# Patient Record
Sex: Male | Born: 1990 | Race: Black or African American | Hispanic: No | Smoking: Never smoker
Health system: Southern US, Community
[De-identification: ages and names within clinical notes are randomized; demographics above are authoritative.]

## PROBLEM LIST (undated history)

## (undated) HISTORY — PX: WISDOM TOOTH EXTRACTION: SHX21

---

## 2017-04-17 ENCOUNTER — Emergency Department (HOSPITAL_COMMUNITY): Payer: 59

## 2017-04-17 ENCOUNTER — Inpatient Hospital Stay (HOSPITAL_COMMUNITY): Payer: 59

## 2017-04-17 ENCOUNTER — Encounter (HOSPITAL_COMMUNITY): Payer: Self-pay | Admitting: Radiology

## 2017-04-17 ENCOUNTER — Inpatient Hospital Stay (HOSPITAL_COMMUNITY)
Admission: EM | Admit: 2017-04-17 | Discharge: 2017-04-21 | DRG: 963 | Disposition: A | Discharge status: Home / Self Care | Payer: 59 | Attending: General Surgery | Admitting: General Surgery

## 2017-04-17 DIAGNOSIS — W231XXA Caught, crushed, jammed, or pinched between stationary objects, initial encounter: Secondary | ICD-10-CM | POA: Diagnosis present

## 2017-04-17 DIAGNOSIS — G931 Anoxic brain damage, not elsewhere classified: Secondary | ICD-10-CM | POA: Diagnosis present

## 2017-04-17 DIAGNOSIS — J690 Pneumonitis due to inhalation of food and vomit: Secondary | ICD-10-CM | POA: Diagnosis present

## 2017-04-17 DIAGNOSIS — S270XXA Traumatic pneumothorax, initial encounter: Secondary | ICD-10-CM | POA: Diagnosis present

## 2017-04-17 DIAGNOSIS — N179 Acute kidney failure, unspecified: Secondary | ICD-10-CM | POA: Diagnosis present

## 2017-04-17 DIAGNOSIS — Y92810 Car as the place of occurrence of the external cause: Secondary | ICD-10-CM | POA: Diagnosis not present

## 2017-04-17 DIAGNOSIS — R402342 Coma scale, best motor response, flexion withdrawal, at arrival to emergency department: Secondary | ICD-10-CM | POA: Diagnosis present

## 2017-04-17 DIAGNOSIS — H1133 Conjunctival hemorrhage, bilateral: Secondary | ICD-10-CM | POA: Diagnosis present

## 2017-04-17 DIAGNOSIS — R0901 Asphyxia: Secondary | ICD-10-CM | POA: Diagnosis present

## 2017-04-17 DIAGNOSIS — R258 Other abnormal involuntary movements: Secondary | ICD-10-CM | POA: Diagnosis present

## 2017-04-17 DIAGNOSIS — R402212 Coma scale, best verbal response, none, at arrival to emergency department: Secondary | ICD-10-CM | POA: Diagnosis present

## 2017-04-17 DIAGNOSIS — S280XXA Crushed chest, initial encounter: Principal | ICD-10-CM | POA: Diagnosis present

## 2017-04-17 DIAGNOSIS — E876 Hypokalemia: Secondary | ICD-10-CM | POA: Diagnosis present

## 2017-04-17 DIAGNOSIS — S299XXA Unspecified injury of thorax, initial encounter: Secondary | ICD-10-CM | POA: Diagnosis present

## 2017-04-17 DIAGNOSIS — J9601 Acute respiratory failure with hypoxia: Secondary | ICD-10-CM | POA: Diagnosis present

## 2017-04-17 DIAGNOSIS — S27321A Contusion of lung, unilateral, initial encounter: Secondary | ICD-10-CM | POA: Diagnosis not present

## 2017-04-17 DIAGNOSIS — Z9289 Personal history of other medical treatment: Secondary | ICD-10-CM

## 2017-04-17 DIAGNOSIS — D72829 Elevated white blood cell count, unspecified: Secondary | ICD-10-CM | POA: Diagnosis present

## 2017-04-17 DIAGNOSIS — S27322A Contusion of lung, bilateral, initial encounter: Secondary | ICD-10-CM | POA: Diagnosis not present

## 2017-04-17 DIAGNOSIS — S27329A Contusion of lung, unspecified, initial encounter: Secondary | ICD-10-CM | POA: Insufficient documentation

## 2017-04-17 DIAGNOSIS — R402112 Coma scale, eyes open, never, at arrival to emergency department: Secondary | ICD-10-CM | POA: Diagnosis present

## 2017-04-17 DIAGNOSIS — T17908A Unspecified foreign body in respiratory tract, part unspecified causing other injury, initial encounter: Secondary | ICD-10-CM

## 2017-04-17 DIAGNOSIS — R042 Hemoptysis: Secondary | ICD-10-CM | POA: Insufficient documentation

## 2017-04-17 LAB — PREPARE FRESH FROZEN PLASMA
UNIT DIVISION: 0
Unit division: 0

## 2017-04-17 LAB — APTT: aPTT: 23 seconds — ABNORMAL LOW (ref 24–36)

## 2017-04-17 LAB — BPAM RBC
Blood Product Expiration Date: 201807122359
Blood Product Expiration Date: 201807132359
ISSUE DATE / TIME: 201807081527
ISSUE DATE / TIME: 201807081527
UNIT TYPE AND RH: 9500
Unit Type and Rh: 9500

## 2017-04-17 LAB — COMPREHENSIVE METABOLIC PANEL
ALK PHOS: 50 U/L (ref 38–126)
ALT: 64 U/L — AB (ref 17–63)
ANION GAP: 11 (ref 5–15)
AST: 91 U/L — ABNORMAL HIGH (ref 15–41)
Albumin: 4.2 g/dL (ref 3.5–5.0)
BILIRUBIN TOTAL: 0.6 mg/dL (ref 0.3–1.2)
BUN: 11 mg/dL (ref 6–20)
CALCIUM: 8.7 mg/dL — AB (ref 8.9–10.3)
CO2: 23 mmol/L (ref 22–32)
CREATININE: 1.51 mg/dL — AB (ref 0.61–1.24)
Chloride: 105 mmol/L (ref 101–111)
Glucose, Bld: 152 mg/dL — ABNORMAL HIGH (ref 65–99)
Potassium: 4.1 mmol/L (ref 3.5–5.1)
Sodium: 139 mmol/L (ref 135–145)
TOTAL PROTEIN: 6.7 g/dL (ref 6.5–8.1)

## 2017-04-17 LAB — TYPE AND SCREEN
ABO/RH(D): O POS
ANTIBODY SCREEN: NEGATIVE
UNIT DIVISION: 0
Unit division: 0

## 2017-04-17 LAB — BPAM FFP
BLOOD PRODUCT EXPIRATION DATE: 201807122359
Blood Product Expiration Date: 201807122359
ISSUE DATE / TIME: 201807081528
ISSUE DATE / TIME: 201807081528
UNIT TYPE AND RH: 600
Unit Type and Rh: 6200

## 2017-04-17 LAB — BASIC METABOLIC PANEL
ANION GAP: 9 (ref 5–15)
BUN: 11 mg/dL (ref 6–20)
CALCIUM: 8.5 mg/dL — AB (ref 8.9–10.3)
CO2: 23 mmol/L (ref 22–32)
CREATININE: 1.3 mg/dL — AB (ref 0.61–1.24)
Chloride: 105 mmol/L (ref 101–111)
GFR calc Af Amer: >60 mL/min (ref >60)
GLUCOSE: 111 mg/dL — AB (ref 65–99)
Potassium: 3.9 mmol/L (ref 3.5–5.1)
Sodium: 137 mmol/L (ref 135–145)

## 2017-04-17 LAB — I-STAT ARTERIAL BLOOD GAS, ED
BICARBONATE: 27.3 mmol/L (ref 20.0–28.0)
O2 SAT: 90 %
PCO2 ART: 53.1 mmHg — AB (ref 32.0–48.0)
Patient temperature: 98.6
TCO2: 29 mmol/L (ref 0–100)
pH, Arterial: 7.319 — ABNORMAL LOW (ref 7.350–7.450)
pO2, Arterial: 64 mmHg — ABNORMAL LOW (ref 83.0–108.0)

## 2017-04-17 LAB — CBC WITH DIFFERENTIAL/PLATELET
Basophils Absolute: 0 10*3/uL (ref 0.0–0.1)
Basophils Relative: 0 %
EOS PCT: 2 %
Eosinophils Absolute: 0.2 10*3/uL (ref 0.0–0.7)
HEMATOCRIT: 40.2 % (ref 39.0–52.0)
Hemoglobin: 13.5 g/dL (ref 13.0–17.0)
LYMPHS PCT: 33 %
Lymphs Abs: 3.6 10*3/uL (ref 0.7–4.0)
MCH: 28.2 pg (ref 26.0–34.0)
MCHC: 33.6 g/dL (ref 30.0–36.0)
MCV: 84.1 fL (ref 78.0–100.0)
MONO ABS: 0.6 10*3/uL (ref 0.1–1.0)
MONOS PCT: 5 %
NEUTROS ABS: 6.3 10*3/uL (ref 1.7–7.7)
Neutrophils Relative %: 60 %
PLATELETS: 317 10*3/uL (ref 150–400)
RBC: 4.78 MIL/uL (ref 4.22–5.81)
RDW: 12.5 % (ref 11.5–15.5)
WBC: 10.7 10*3/uL — ABNORMAL HIGH (ref 4.0–10.5)

## 2017-04-17 LAB — PROTIME-INR
INR: 1.09
INR: 1.1
Prothrombin Time: 14.2 seconds (ref 11.4–15.2)
Prothrombin Time: 14.2 seconds (ref 11.4–15.2)

## 2017-04-17 LAB — ABO/RH: ABO/RH(D): O POS

## 2017-04-17 LAB — TROPONIN I: TROPONIN I: 18.86 ng/mL — AB (ref <0.03)

## 2017-04-17 LAB — GLUCOSE, CAPILLARY
GLUCOSE-CAPILLARY: 84 mg/dL (ref 65–99)
GLUCOSE-CAPILLARY: 75 mg/dL (ref 65–99)
Glucose-Capillary: 78 mg/dL (ref 65–99)

## 2017-04-17 LAB — TRIGLYCERIDES
Triglycerides: 73 mg/dL (ref <150)
Triglycerides: 58 mg/dL (ref <150)

## 2017-04-17 LAB — CK: CK TOTAL: 535 U/L — AB (ref 49–397)

## 2017-04-17 LAB — MRSA PCR SCREENING: MRSA BY PCR: NEGATIVE

## 2017-04-17 LAB — ETHANOL: Alcohol, Ethyl (B): <5 mg/dL (ref <5)

## 2017-04-17 MED ORDER — CISATRACURIUM BOLUS VIA INFUSION
0.1000 mg/kg | Freq: Once | INTRAVENOUS | Status: DC
  Filled 2017-04-17: qty 9

## 2017-04-17 MED ORDER — FENTANYL CITRATE (PF) 100 MCG/2ML IJ SOLN: 50.0000 ug | Freq: Once | INTRAMUSCULAR | Status: DC

## 2017-04-17 MED ORDER — FENTANYL BOLUS VIA INFUSION
50.0000 ug | INTRAVENOUS | Status: DC | PRN
  Filled 2017-04-17: qty 50

## 2017-04-17 MED ORDER — FENTANYL CITRATE (PF) 100 MCG/2ML IJ SOLN: 100.0000 ug | Freq: Once | INTRAMUSCULAR | Status: DC

## 2017-04-17 MED ORDER — SUCCINYLCHOLINE CHLORIDE 20 MG/ML IJ SOLN
INTRAMUSCULAR | Status: AC | PRN
  Administered 2017-04-17: 150 mg via INTRAVENOUS

## 2017-04-17 MED ORDER — FENTANYL CITRATE (PF) 100 MCG/2ML IJ SOLN: 100.0000 ug | INTRAMUSCULAR | Status: DC | PRN

## 2017-04-17 MED ORDER — PANTOPRAZOLE SODIUM 40 MG PO TBEC: 40.0000 mg | DELAYED_RELEASE_TABLET | Freq: Every day | ORAL | Status: DC

## 2017-04-17 MED ORDER — PROPOFOL 1000 MG/100ML IV EMUL
INTRAVENOUS | Status: AC
  Filled 2017-04-17: qty 100

## 2017-04-17 MED ORDER — SODIUM CHLORIDE 0.9 % IV SOLN: 250.0000 mL | INTRAVENOUS | Status: DC | PRN

## 2017-04-17 MED ORDER — SODIUM CHLORIDE 0.9 % IV SOLN
2.0000 mg/h | INTRAVENOUS | Status: DC
  Administered 2017-04-17: 2 mg/h via INTRAVENOUS
  Administered 2017-04-18 (×3): 8 mg/h via INTRAVENOUS
  Administered 2017-04-18: 2 mg/h via INTRAVENOUS
  Administered 2017-04-19: 8 mg/h via INTRAVENOUS
  Filled 2017-04-17 (×6): qty 10

## 2017-04-17 MED ORDER — DEXTROSE 5 % IV SOLN
1.0000 g | INTRAVENOUS | Status: DC
  Administered 2017-04-17: 1 g via INTRAVENOUS
  Filled 2017-04-17: qty 10

## 2017-04-17 MED ORDER — NOREPINEPHRINE BITARTRATE 1 MG/ML IV SOLN
0.0000 ug/min | INTRAVENOUS | Status: DC
  Administered 2017-04-18: 2 ug/min via INTRAVENOUS
  Filled 2017-04-17 (×2): qty 4

## 2017-04-17 MED ORDER — PANTOPRAZOLE SODIUM 40 MG IV SOLR
40.0000 mg | Freq: Every day | INTRAVENOUS | Status: DC
  Administered 2017-04-17: 40 mg via INTRAVENOUS
  Filled 2017-04-17: qty 40

## 2017-04-17 MED ORDER — ASPIRIN 300 MG RE SUPP
300.0000 mg | RECTAL | Status: AC
  Administered 2017-04-18: 300 mg via RECTAL
  Filled 2017-04-17: qty 1

## 2017-04-17 MED ORDER — FENTANYL 2500MCG IN NS 250ML (10MCG/ML) PREMIX INFUSION
25.0000 ug/h | INTRAVENOUS | Status: DC
  Filled 2017-04-17: qty 250

## 2017-04-17 MED ORDER — PROPOFOL 1000 MG/100ML IV EMUL
0.0000 ug/kg/min | INTRAVENOUS | Status: DC
  Administered 2017-04-17: 40 ug/kg/min via INTRAVENOUS

## 2017-04-17 MED ORDER — ACETAMINOPHEN 325 MG PO TABS
650.0000 mg | ORAL_TABLET | ORAL | Status: DC | PRN
  Administered 2017-04-19 – 2017-04-20 (×5): 650 mg via ORAL
  Filled 2017-04-17 (×5): qty 2

## 2017-04-17 MED ORDER — PROPOFOL 1000 MG/100ML IV EMUL: 25.0000 ug/kg/min | INTRAVENOUS | Status: DC

## 2017-04-17 MED ORDER — SODIUM CHLORIDE 0.9 % IV SOLN: INTRAVENOUS | Status: DC

## 2017-04-17 MED ORDER — SODIUM CHLORIDE 0.9 % IV SOLN
1.0000 ug/kg/min | INTRAVENOUS | Status: DC
  Filled 2017-04-17: qty 20

## 2017-04-17 MED ORDER — FENTANYL 2500MCG IN NS 250ML (10MCG/ML) PREMIX INFUSION
100.0000 ug/h | INTRAVENOUS | Status: DC
  Administered 2017-04-17: 300 ug/h via INTRAVENOUS
  Administered 2017-04-17: 100 ug/h via INTRAVENOUS
  Administered 2017-04-18 – 2017-04-19 (×5): 300 ug/h via INTRAVENOUS
  Filled 2017-04-17 (×5): qty 250

## 2017-04-17 MED ORDER — PROPOFOL 10 MG/ML IV BOLUS
INTRAVENOUS | Status: AC | PRN
  Administered 2017-04-17: 860 ug via INTRAVENOUS

## 2017-04-17 MED ORDER — MIDAZOLAM HCL 2 MG/2ML IJ SOLN: 2.0000 mg | Freq: Once | INTRAMUSCULAR | Status: DC

## 2017-04-17 MED ORDER — CISATRACURIUM BOLUS VIA INFUSION
0.1000 mg/kg | Freq: Once | INTRAVENOUS | Status: AC
  Administered 2017-04-17: 8.6 mg via INTRAVENOUS

## 2017-04-17 MED ORDER — MIDAZOLAM BOLUS VIA INFUSION
2.0000 mg | INTRAVENOUS | Status: DC | PRN
  Administered 2017-04-18: 2 mg via INTRAVENOUS
  Filled 2017-04-17: qty 2

## 2017-04-17 MED ORDER — CISATRACURIUM BOLUS VIA INFUSION
0.0500 mg/kg | INTRAVENOUS | Status: AC | PRN
  Administered 2017-04-18: 4.3 mg via INTRAVENOUS
  Filled 2017-04-17: qty 5

## 2017-04-17 MED ORDER — SODIUM CHLORIDE 0.9 % IV SOLN
3.0000 g | Freq: Four times a day (QID) | INTRAVENOUS | Status: DC
  Administered 2017-04-17 – 2017-04-20 (×11): 3 g via INTRAVENOUS
  Filled 2017-04-17 (×14): qty 3

## 2017-04-17 MED ORDER — INSULIN ASPART 100 UNIT/ML ~~LOC~~ SOLN: 1.0000 [IU] | SUBCUTANEOUS | Status: DC

## 2017-04-17 MED ORDER — SODIUM CHLORIDE 0.9 % IV SOLN
1.0000 ug/kg/min | INTRAVENOUS | Status: DC
  Administered 2017-04-17 (×2): 1.5 ug/kg/min via INTRAVENOUS
  Administered 2017-04-17: 1 ug/kg/min via INTRAVENOUS
  Administered 2017-04-18 – 2017-04-19 (×2): 1.5 ug/kg/min via INTRAVENOUS
  Filled 2017-04-17 (×2): qty 20

## 2017-04-17 MED ORDER — ETOMIDATE 2 MG/ML IV SOLN
INTRAVENOUS | Status: AC | PRN
  Administered 2017-04-17: 20 mg via INTRAVENOUS

## 2017-04-17 MED ORDER — PROPOFOL 1000 MG/100ML IV EMUL
0.0000 ug/kg/min | INTRAVENOUS | Status: DC
  Administered 2017-04-17: 30 ug/kg/min via INTRAVENOUS
  Administered 2017-04-17: 20 ug/kg/min via INTRAVENOUS
  Filled 2017-04-17: qty 100

## 2017-04-17 MED ORDER — ARTIFICIAL TEARS OPHTHALMIC OINT
1.0000 "application " | TOPICAL_OINTMENT | Freq: Three times a day (TID) | OPHTHALMIC | Status: DC
  Filled 2017-04-17: qty 3.5

## 2017-04-17 MED ORDER — CISATRACURIUM BOLUS VIA INFUSION
0.0500 mg/kg | INTRAVENOUS | Status: DC | PRN
  Filled 2017-04-17: qty 5

## 2017-04-17 MED ORDER — SODIUM CHLORIDE 0.9 % IV SOLN
INTRAVENOUS | Status: DC
  Administered 2017-04-20: 01:00:00 via INTRAVENOUS

## 2017-04-17 MED ORDER — FENTANYL 2500MCG IN NS 250ML (10MCG/ML) PREMIX INFUSION: 100.0000 ug/h | INTRAVENOUS | Status: DC

## 2017-04-17 MED ORDER — FAMOTIDINE IN NACL 20-0.9 MG/50ML-% IV SOLN
20.0000 mg | Freq: Two times a day (BID) | INTRAVENOUS | Status: DC
  Administered 2017-04-17 – 2017-04-19 (×5): 20 mg via INTRAVENOUS
  Filled 2017-04-17 (×5): qty 50

## 2017-04-17 MED ORDER — DOCUSATE SODIUM 50 MG/5ML PO LIQD
100.0000 mg | Freq: Two times a day (BID) | ORAL | Status: DC | PRN
  Filled 2017-04-17: qty 10

## 2017-04-17 MED ORDER — SODIUM CHLORIDE 0.9 % IV SOLN
1000.0000 mg | Freq: Once | INTRAVENOUS | Status: AC
  Administered 2017-04-17: 1000 mg via INTRAVENOUS
  Filled 2017-04-17: qty 10

## 2017-04-17 MED ORDER — SODIUM CHLORIDE 0.9 % IV SOLN
0.5000 mg/h | INTRAVENOUS | Status: DC
  Filled 2017-04-17: qty 10

## 2017-04-17 MED ORDER — ARTIFICIAL TEARS OPHTHALMIC OINT
1.0000 "application " | TOPICAL_OINTMENT | Freq: Three times a day (TID) | OPHTHALMIC | Status: DC
  Administered 2017-04-17 – 2017-04-19 (×5): 1 via OPHTHALMIC

## 2017-04-17 MED ORDER — ONDANSETRON HCL 4 MG/2ML IJ SOLN
4.0000 mg | Freq: Four times a day (QID) | INTRAMUSCULAR | Status: DC | PRN
  Administered 2017-04-19 – 2017-04-20 (×2): 4 mg via INTRAVENOUS
  Filled 2017-04-17 (×2): qty 2

## 2017-04-17 MED ORDER — IOPAMIDOL (ISOVUE-300) INJECTION 61%
INTRAVENOUS | Status: AC
  Administered 2017-04-17: 75 mL
  Filled 2017-04-17: qty 75

## 2017-04-17 NOTE — ED Notes (Signed)
Pt intubated with 7.5 ET  secured 21 at the teeth , good color change , chest x ray ordered

## 2017-04-17 NOTE — ED Provider Notes (Signed)
INTUBATION Performed by: Orson SlickAndrew Soua Caltagirone  Required items: required blood products, implants, devices, and special equipment available Patient identity confirmed: provided demographic data and hospital-assigned identification number Time out: Immediately prior to procedure a "time out" was called to verify the correct patient, procedure, equipment, support staff and site/side marked as required. Procedure performed in emergent care.  Indications: Airway protection, oxygen desaturation.   Intubation method: Glidescope Laryngoscopy   Preoxygenation: BVM  Sedatives: Etomidate Paralytic: Succinylcholine  Tube Size: 7.5 cuffed, 21 cm at the lip   Post-procedure assessment: chest rise and ETCO2 monitor Breath sounds: equal and absent over the epigastrium Tube secured with: ETT holder Chest x-ray interpreted by radiologist and me.  Chest x-ray findings: endotracheal tube in appropriate position  Patient tolerated the procedure well with no immediate complications.      Orson Slickolson, Brittan Mapel, MD 04/17/17 Dorothy [id number]    Rolland PorterJames, Mark, MD 04/27/17 [phone]

## 2017-04-17 NOTE — Procedures (Signed)
Central Venous Catheter Insertion Procedure Note Mitchell BeetsDuane Snyder [id number] 1991/04/22  Procedure: Insertion of Central Venous Catheter Indications: Assessment of intravascular volume, Drug and/or fluid administration and Frequent blood sampling  Procedure Details Consent: Unable to obtain consent because of emergent medical necessity. Time Out: Verified patient identification, verified procedure, site/side was marked, verified correct patient position, special equipment/implants available, medications/allergies/relevent history reviewed, required imaging and test results available.  Performed  Maximum sterile technique was used including antiseptics, cap, gloves, gown, hand hygiene, mask and sheet. Skin prep: Chlorhexidine; local anesthetic administered A antimicrobial bonded/coated triple lumen catheter was placed in the left femoral vein due to traumatic injury/in c-collar using the Seldinger technique.  Evaluation Blood flow good Complications: No apparent complications Patient did tolerate procedure well. Chest X-ray ordered to verify placement.  Mitchell KussmaulKatalina Kristyna Snyder, AGACNP-BC [facility]  Pgr: [phone]  PCCM Pgr: [phone]

## 2017-04-17 NOTE — ED Notes (Signed)
Hypothermia pads applied to pt; afterwards pt was agitated and moving all extremities; reached up with right arm into air; not following commands; bolus of sedation given
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
## 2017-04-17 NOTE — Progress Notes (Signed)
   Met w/ family at ED Consult B.  Escorted 2 family members to bedside ([facility]).  Family remain at bedside.  Will follow, as needed.

## 2017-04-17 NOTE — H&P (Addendum)
History   Mitchell Snyder is an 26 y.o. male.   Chief Complaint:  Chief Complaint  Patient presents with  . Trauma    Trauma Mechanism of injury: crush Injury Injury location: torso, face and head/neck Injury location detail: head, face and R chest and L chest Incident location: home Time since incident: 30 minutes Arrived directly from scene: yes   Protective equipment:       None      Suspicion of alcohol use: no      Suspicion of drug use: no  EMS/PTA data:      Bystander interventions: none      Ambulatory at scene: no      Blood loss: none      Responsiveness: unresponsive      Loss of consciousness: yes      Loss of consciousness duration: 30 minutes      Airway interventions: none      Breathing interventions: oxygen      IV access: established      IO access: none      Fluids administered: normal saline      Cardiac interventions: none      Medications administered: Versed.      Immobilization: long board and C-collar      Airway condition since incident: worsening      Breathing condition since incident: worsening      Circulation condition since incident: stable      Mental status condition since incident: stable      Disability condition since incident: worsening  Current symptoms:      Associated symptoms:            Reports loss of consciousness.   Relevant PMH:      Tetanus status: unknown      The patient has not been admitted to the hospital due to injury in the past year, and has not been treated and released from the ED due to injury in the past year.   No past medical history on file.  No past surgical history on file.  No family history on file. Social History:  has no tobacco, alcohol, and drug history on file.  Allergies  Not on File  Home Medications   (Not in a hospital admission)  Trauma Course   Results for orders placed or performed during the hospital encounter of 04/17/17 (from the past 48 hour(s))  Type and screen      Status: None (Preliminary result)   Collection Time: 04/17/17  3:24 PM  Result Value Ref Range   ABO/RH(D) PENDING    Antibody Screen PENDING    Sample Expiration 04/20/2017    Unit Number [id number]    Blood Component Type RBC, LR IRR    Unit division 00    Status of Unit ISSUED    Unit tag comment VERBAL ORDERS PER DR Ingeborg Fite    Transfusion Status OK TO TRANSFUSE    Crossmatch Result PENDING    Unit Number [id number]    Blood Component Type RED CELLS,LR    Unit division 00    Status of Unit ISSUED    Unit tag comment VERBAL ORDERS PER DR Markelle Asaro    Transfusion Status OK TO TRANSFUSE    Crossmatch Result PENDING   Prepare fresh frozen plasma     Status: None (Preliminary result)   Collection Time: 04/17/17  3:24 PM  Result Value Ref Range   Unit Number [id number]    Blood Component Type THAWED PLASMA  Unit division 00    Status of Unit ISSUED    Unit tag comment VERBAL ORDERS PER DR Tylon Kemmerling    Transfusion Status OK TO TRANSFUSE    Unit Number [id number]    Blood Component Type THAWED PLASMA    Unit division 00    Status of Unit ISSUED    Unit tag comment VERBAL ORDERS PER DR Fayrene FearingJAMES    Transfusion Status OK TO TRANSFUSE   I-Stat arterial blood gas, ED     Status: Abnormal   Collection Time: 04/17/17  3:54 PM  Result Value Ref Range   pH, Arterial 7.319 (L) 7.350 - 7.450   pCO2 arterial 53.1 (H) 32.0 - 48.0 mmHg   pO2, Arterial 64.0 (L) 83.0 - 108.0 mmHg   Bicarbonate 27.3 20.0 - 28.0 mmol/L   TCO2 29 0 - 100 mmol/L   O2 Saturation 90.0 %   Patient temperature 98.6 F    Collection site RADIAL, ALLEN'S TEST ACCEPTABLE    Drawn by RT    Sample type ARTERIAL    No results found.  Review of Systems  Neurological: Positive for loss of consciousness.    There were no vitals taken for this visit. Physical Exam  HENT:  Head: Normocephalic.    Nose: No nose lacerations or nasal deformity. Epistaxis is observed.  Mouth/Throat: Oropharynx is clear and moist.    Ear canals full of wax  Eyes: Conjunctivae and EOM are normal. Pupils are equal, round, and reactive to light. Right pupil is reactive. Left pupil is reactive.  Dysconjugate gaze  Neck: Normal range of motion. Neck supple.  Cardiovascular: Normal rate and normal heart sounds.   Respiratory: He has no wheezes. He has no rales.    GI: Soft. Bowel sounds are normal.  FAST negative  Genitourinary: Rectum normal, prostate normal and penis normal.  Genitourinary Comments: Normal rectal tone, no gross blood  Musculoskeletal:  Some posturing  Neurological: He is unresponsive. He displays abnormal reflex. GCS eye subscore is 1. GCS verbal subscore is 2. GCS motor subscore is 3. He displays Babinski's sign on the right side. He displays Babinski's sign on the left side.  Reflex Scores:      Bicep reflexes are 3+ on the right side and 3+ on the left side.      Patellar reflexes are 3+ on the right side and 3+ on the left side.      Achilles reflexes are 3+ on the right side and 3+ on the left side. Skin: Skin is warm and dry.     Assessment/Plan Crush compression injury to chest with likely hypoxic brain injury  I have reviewed the scans with the radiologist:  1.  No intracranial injury; 2.  No apparent C-spine injury; 3.  Either right upper pulmonary contusion versus aspiration; 4.  No evidence of increase intracranial pressure; 5.  FAST examination not indicative on intra-abdominal bleeding or fluid; 6.  No evidence of maxillo-facial injury 7.  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 Amada JupiterKirkpatrick) to see the patient and he is considering hypothermia for potential anoxic insult.  Otherwise I will admit the patien to the Trauma ICU with sedation. Family not present.  Calahan Pak 04/17/2017, 4:05 PM   Procedures   FAST examination without evidence of acute hemorrhage or intraperitoneal  blood:      EPIG   RUQ   LUQ   PELVIC  FAST negative

## 2017-04-17 NOTE — Consult Note (Addendum)
Neurology Consultation Reason for Consult: Anoxic brain injury Referring Physician: Jimmye NormanWyatt, James  CC: Anoxic brain injury  History is obtained from: Medical record  HPI: Mitchell Snyder is a 26 y.o. male who was working on his car when the [place] stand gave way and the brake drum fell on his chest causing suffocation. He was brought in as a level one trauma, but his only clear  source of injury is hypoxia at this time. He is posturing to any type of stimulus, but no clear seizure activity other than this.  He was given a dose of Keppra per trauma for the extensor posturing.  ROS: Unable to obtain due to altered mental status.   Past medical history: Unable to obtain due to altered mental status  Family history: Unable to obtain due to altered mental status  Social History: Unable to obtain due to altered mental status  Exam: Current vital signs: BP 105/68   Pulse 86   Temp (!) 97.2 F (36.2 C)   Resp (!) 23   Ht 6' (1.829 m)   Wt 86.2 kg (190 lb)   SpO2 100%   BMI 25.77 kg/m  Vital signs in last 24 hours: Temp:  [97.2 F (36.2 C)] 97.2 F (36.2 C) (07/08 1700) Pulse Rate:  [74-106] 86 (07/08 1700) Resp:  [20-30] 23 (07/08 1700) BP: (95-123)/(46-88) 105/68 (07/08 1700) SpO2:  [87 %-100 %] 100 % (07/08 1700) FiO2 (%):  [100 %] 100 % (07/08 1600) Weight:  [86.2 kg (190 lb)] 86.2 kg (190 lb) (07/08 1656)   Physical Exam  Constitutional: Appears well-developed and well-nourished.  Psych: Affect appropriate to situation Eyes: No scleral injection HENT: Intubated Head: Normocephalic.  Cardiovascular: Normal rate and regular rhythm.  Respiratory: Ventilated GI: Soft.  No distension. There is no tenderness.  Skin: WDI  Neuro: Mental Status: Patient is comatose Cranial Nerves: II: Does not blink to threat. Pupils are equal, round, and reactive to light.   III,IV, VI: Eyes are slightly disconjugate, doll's not checked due to c-collar V: VII: Corneals are intact  bilaterally X: Cough intact Motor: Extensor posturing in his legs, flexor posturing in his arms bilaterally any noxious stimulation Sensory: As above  DTR: Clonus at ankles bilaterally Cerebellar: He does not perform   I have reviewed labs in epic and the results pertinent to this consultation are: Mild leukocytosis, mild creatinine elevation  I have reviewed the images obtained: CT head-normal  Impression: 26 year old male with hypoxic brain injury. Though data for this particular type of hypoxic injury is lacking, I think extrapolating from hanging and/or cardiac arrest does make me think that cooling may be appropriate in this case  I would favor targeted temperature management and have consulted critical care medicine further assistance with this process.  I do not think the extensor posturing that we are seeing is seizure activity, but I would favor continuous EEG overnight given the abnormal movements.  Recommendations: 1) targeted temperature management per CCM 2) overnight EEG 3) neurology will continue to follow  Ritta SlotMcNeill Owenn Rothermel, MD [facility] [phone]  If 7pm- 7am, please page neurology on call as listed in [facility].

## 2017-04-17 NOTE — Progress Notes (Signed)
Orthopedic Tech Progress Note Patient Details:  Fredia BeetsDuane Ruelas October 01, 1991 [id number]  Patient ID: Fredia BeetsDuane Luft, male   DOB: October 01, 1991, 26 y.o.   MRN: [id number]   Nikki DomCrawford, [place] Jr 04/17/2017, 3:45 PM Made level 1 trauma visit

## 2017-04-17 NOTE — ED Notes (Signed)
Family at beside. Family given emotional support. 

## 2017-04-17 NOTE — Progress Notes (Signed)
STAT LTM hooked up.

## 2017-04-17 NOTE — Progress Notes (Addendum)
Attempted arterial line X 2 without success in right radial artery.  Unable to thread catheter, suspect vasoconstriction with patient cooling.  Charge RT attempted X1 left radial, again unable to thread catheter due to vasoconstriction.  Patient's nurse aware.
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
## 2017-04-17 NOTE — Progress Notes (Signed)
Patient transported from ED to 2H13 without any complications.

## 2017-04-18 ENCOUNTER — Inpatient Hospital Stay (HOSPITAL_COMMUNITY): Payer: 59

## 2017-04-18 DIAGNOSIS — G931 Anoxic brain damage, not elsewhere classified: Secondary | ICD-10-CM

## 2017-04-18 LAB — POCT I-STAT, CHEM 8
BUN: 10 mg/dL (ref 6–20)
BUN: 11 mg/dL (ref 6–20)
BUN: 9 mg/dL (ref 6–20)
CALCIUM ION: 1.15 mmol/L (ref 1.15–1.40)
CALCIUM ION: 1.22 mmol/L (ref 1.15–1.40)
CHLORIDE: 103 mmol/L (ref 101–111)
CHLORIDE: 106 mmol/L (ref 101–111)
Calcium, Ion: 1.1 mmol/L — ABNORMAL LOW (ref 1.15–1.40)
Chloride: 104 mmol/L (ref 101–111)
Creatinine, Ser: 1 mg/dL (ref 0.61–1.24)
Creatinine, Ser: 1 mg/dL (ref 0.61–1.24)
Creatinine, Ser: 1.1 mg/dL (ref 0.61–1.24)
GLUCOSE: 107 mg/dL — AB (ref 65–99)
Glucose, Bld: 109 mg/dL — ABNORMAL HIGH (ref 65–99)
Glucose, Bld: 111 mg/dL — ABNORMAL HIGH (ref 65–99)
HCT: 40 % (ref 39.0–52.0)
HCT: 43 % (ref 39.0–52.0)
HEMATOCRIT: 42 % (ref 39.0–52.0)
HEMOGLOBIN: 13.6 g/dL (ref 13.0–17.0)
HEMOGLOBIN: 14.3 g/dL (ref 13.0–17.0)
Hemoglobin: 14.6 g/dL (ref 13.0–17.0)
POTASSIUM: 3.9 mmol/L (ref 3.5–5.1)
Potassium: 3.8 mmol/L (ref 3.5–5.1)
Potassium: 3.9 mmol/L (ref 3.5–5.1)
SODIUM: 142 mmol/L (ref 135–145)
SODIUM: 143 mmol/L (ref 135–145)
Sodium: 143 mmol/L (ref 135–145)
TCO2: 24 mmol/L (ref 0–100)
TCO2: 25 mmol/L (ref 0–100)
TCO2: 26 mmol/L (ref 0–100)

## 2017-04-18 LAB — BLOOD GAS, ARTERIAL
ACID-BASE DEFICIT: 1.2 mmol/L (ref 0.0–2.0)
ACID-BASE DEFICIT: 1.7 mmol/L (ref 0.0–2.0)
BICARBONATE: 22.6 mmol/L (ref 20.0–28.0)
Bicarbonate: 22.3 mmol/L (ref 20.0–28.0)
Drawn by: 10006
Drawn by: 448981
FIO2: 30
FIO2: 30
LHR: 16 {breaths}/min
MECHVT: 600 mL
O2 SAT: 98.8 %
O2 Saturation: 99.1 %
PATIENT TEMPERATURE: 92.5
PATIENT TEMPERATURE: 91
PEEP/CPAP: 5 cmH2O
PEEP/CPAP: 5 cmH2O
PH ART: 7.5 — AB (ref 7.350–7.450)
PO2 ART: 129 mmHg — AB (ref 83.0–108.0)
PO2 ART: 147 mmHg — AB (ref 83.0–108.0)
RATE: 16 resp/min
VT: 550 mL
pCO2 arterial: 27.7 mmHg — ABNORMAL LOW (ref 32.0–48.0)
pCO2 arterial: 31.4 mmHg — ABNORMAL LOW (ref 32.0–48.0)
pH, Arterial: 7.447 (ref 7.350–7.450)

## 2017-04-18 LAB — POCT I-STAT 3, ART BLOOD GAS (G3+)
Bicarbonate: 23.9 mmol/L (ref 20.0–28.0)
O2 Saturation: 100 %
PCO2 ART: 28.6 mmHg — AB (ref 32.0–48.0)
PH ART: 7.513 — AB (ref 7.350–7.450)
Patient temperature: 32.7
TCO2: 25 mmol/L (ref 0–100)
pO2, Arterial: 278 mmHg — ABNORMAL HIGH (ref 83.0–108.0)

## 2017-04-18 LAB — BASIC METABOLIC PANEL
ANION GAP: 9 (ref 5–15)
ANION GAP: 8 (ref 5–15)
ANION GAP: 5 (ref 5–15)
Anion gap: 6 (ref 5–15)
Anion gap: 7 (ref 5–15)
BUN: 8 mg/dL (ref 6–20)
BUN: 8 mg/dL (ref 6–20)
BUN: 7 mg/dL (ref 6–20)
BUN: 7 mg/dL (ref 6–20)
BUN: 7 mg/dL (ref 6–20)
CALCIUM: 8.9 mg/dL (ref 8.9–10.3)
CALCIUM: 8.8 mg/dL — AB (ref 8.9–10.3)
CALCIUM: 8.7 mg/dL — AB (ref 8.9–10.3)
CALCIUM: 8.7 mg/dL — AB (ref 8.9–10.3)
CALCIUM: 8.7 mg/dL — AB (ref 8.9–10.3)
CO2: 22 mmol/L (ref 22–32)
CO2: 24 mmol/L (ref 22–32)
CO2: 24 mmol/L (ref 22–32)
CO2: 23 mmol/L (ref 22–32)
CO2: 23 mmol/L (ref 22–32)
CREATININE: 0.93 mg/dL (ref 0.61–1.24)
Chloride: 108 mmol/L (ref 101–111)
Chloride: 108 mmol/L (ref 101–111)
Chloride: 111 mmol/L (ref 101–111)
Chloride: 110 mmol/L (ref 101–111)
Chloride: 108 mmol/L (ref 101–111)
Creatinine, Ser: 1.12 mg/dL (ref 0.61–1.24)
Creatinine, Ser: 0.96 mg/dL (ref 0.61–1.24)
Creatinine, Ser: 1 mg/dL (ref 0.61–1.24)
Creatinine, Ser: 0.99 mg/dL (ref 0.61–1.24)
GFR calc Af Amer: >60 mL/min (ref >60)
GFR calc Af Amer: >60 mL/min (ref >60)
GFR calc Af Amer: >60 mL/min (ref >60)
GFR calc Af Amer: >60 mL/min (ref >60)
GLUCOSE: 91 mg/dL (ref 65–99)
GLUCOSE: 100 mg/dL — AB (ref 65–99)
GLUCOSE: 96 mg/dL (ref 65–99)
Glucose, Bld: 109 mg/dL — ABNORMAL HIGH (ref 65–99)
Glucose, Bld: 100 mg/dL — ABNORMAL HIGH (ref 65–99)
POTASSIUM: 3.2 mmol/L — AB (ref 3.5–5.1)
POTASSIUM: 4.5 mmol/L (ref 3.5–5.1)
POTASSIUM: 3.5 mmol/L (ref 3.5–5.1)
POTASSIUM: 3.6 mmol/L (ref 3.5–5.1)
POTASSIUM: 3.7 mmol/L (ref 3.5–5.1)
SODIUM: 139 mmol/L (ref 135–145)
SODIUM: 140 mmol/L (ref 135–145)
SODIUM: 139 mmol/L (ref 135–145)
SODIUM: 138 mmol/L (ref 135–145)
Sodium: 140 mmol/L (ref 135–145)

## 2017-04-18 LAB — CBC
HEMATOCRIT: 40.7 % (ref 39.0–52.0)
HEMOGLOBIN: 13.7 g/dL (ref 13.0–17.0)
MCH: 28.1 pg (ref 26.0–34.0)
MCHC: 33.7 g/dL (ref 30.0–36.0)
MCV: 83.4 fL (ref 78.0–100.0)
Platelets: 274 10*3/uL (ref 150–400)
RBC: 4.88 MIL/uL (ref 4.22–5.81)
RDW: 12.7 % (ref 11.5–15.5)
WBC: 13 10*3/uL — AB (ref 4.0–10.5)

## 2017-04-18 LAB — TROPONIN I
TROPONIN I: 10.95 ng/mL — AB (ref <0.03)
TROPONIN I: 22.36 ng/mL — AB (ref <0.03)
TROPONIN I: 5.6 ng/mL — AB (ref <0.03)

## 2017-04-18 LAB — GLUCOSE, CAPILLARY
GLUCOSE-CAPILLARY: 102 mg/dL — AB (ref 65–99)
GLUCOSE-CAPILLARY: 104 mg/dL — AB (ref 65–99)
GLUCOSE-CAPILLARY: 107 mg/dL — AB (ref 65–99)
GLUCOSE-CAPILLARY: 109 mg/dL — AB (ref 65–99)
GLUCOSE-CAPILLARY: 85 mg/dL (ref 65–99)
GLUCOSE-CAPILLARY: 94 mg/dL (ref 65–99)
Glucose-Capillary: 96 mg/dL (ref 65–99)
Glucose-Capillary: 106 mg/dL — ABNORMAL HIGH (ref 65–99)
Glucose-Capillary: 115 mg/dL — ABNORMAL HIGH (ref 65–99)
Glucose-Capillary: 88 mg/dL (ref 65–99)
Glucose-Capillary: 96 mg/dL (ref 65–99)
Glucose-Capillary: 82 mg/dL (ref 65–99)

## 2017-04-18 LAB — ECHOCARDIOGRAM COMPLETE
HEIGHTINCHES: 71 in
Weight: 3037.06 oz

## 2017-04-18 LAB — PROTIME-INR
INR: 1.13
PROTHROMBIN TIME: 14.6 s (ref 11.4–15.2)

## 2017-04-18 LAB — RAPID URINE DRUG SCREEN, HOSP PERFORMED
Amphetamines: NOT DETECTED
Barbiturates: NOT DETECTED
Benzodiazepines: POSITIVE — AB
Cocaine: NOT DETECTED
OPIATES: NOT DETECTED
TETRAHYDROCANNABINOL: NOT DETECTED

## 2017-04-18 LAB — MAGNESIUM: MAGNESIUM: 2.1 mg/dL (ref 1.7–2.4)

## 2017-04-18 LAB — BLOOD PRODUCT ORDER (VERBAL) VERIFICATION

## 2017-04-18 LAB — HIV ANTIBODY (ROUTINE TESTING W REFLEX): HIV Screen 4th Generation wRfx: NONREACTIVE

## 2017-04-18 LAB — APTT: aPTT: 30 seconds (ref 24–36)

## 2017-04-18 LAB — PHOSPHORUS: Phosphorus: 3 mg/dL (ref 2.5–4.6)

## 2017-04-18 MED ORDER — ORAL CARE MOUTH RINSE
15.0000 mL | OROMUCOSAL | Status: DC
  Administered 2017-04-18 – 2017-04-19 (×14): 15 mL via OROMUCOSAL

## 2017-04-18 MED ORDER — CHLORHEXIDINE GLUCONATE CLOTH 2 % EX PADS
6.0000 | MEDICATED_PAD | Freq: Every day | CUTANEOUS | Status: DC
  Administered 2017-04-18 – 2017-04-19 (×2): 6 via TOPICAL

## 2017-04-18 MED ORDER — HEPARIN SODIUM (PORCINE) 5000 UNIT/ML IJ SOLN
5000.0000 [IU] | Freq: Three times a day (TID) | INTRAMUSCULAR | Status: DC
  Administered 2017-04-18 – 2017-04-20 (×6): 5000 [IU] via SUBCUTANEOUS
  Filled 2017-04-18 (×6): qty 1

## 2017-04-18 MED ORDER — SODIUM CHLORIDE 0.9% FLUSH
10.0000 mL | Freq: Two times a day (BID) | INTRAVENOUS | Status: DC
  Administered 2017-04-18 – 2017-04-19 (×4): 10 mL

## 2017-04-18 MED ORDER — POTASSIUM CHLORIDE 10 MEQ/50ML IV SOLN
10.0000 meq | INTRAVENOUS | Status: AC
  Administered 2017-04-18 (×2): 10 meq via INTRAVENOUS
  Filled 2017-04-18 (×2): qty 50

## 2017-04-18 MED ORDER — SODIUM CHLORIDE 0.9% FLUSH: 10.0000 mL | INTRAVENOUS | Status: DC | PRN

## 2017-04-18 MED ORDER — CHLORHEXIDINE GLUCONATE 0.12% ORAL RINSE (MEDLINE KIT)
15.0000 mL | Freq: Two times a day (BID) | OROMUCOSAL | Status: DC
  Administered 2017-04-18 – 2017-04-19 (×3): 15 mL via OROMUCOSAL

## 2017-04-18 NOTE — Progress Notes (Signed)
Subjective: Intubated and sedated. Fiance at bedside.  Current Pertinent Medications: Cisatracurium 1-1.735mcg/kg/min Fentanyl 100-31300mcg/hr Versed 2-10mg /hr Propofol 0-7250mcg/kg/min  Pertinent Labs/Diagnostics: CT head 7/8: Preservation of gray - white compartment differentiation. No mass, hemorrhage, or edema. No extra-axial fluid collection or midline shift. CT c-spine 7/8: No fracture or spondylolisthesis.  Physical Examination: Vitals:   04/18/17 1415 04/18/17 1430  BP: 125/81 122/72  Pulse: (!) 41 (!) 42  Resp: 18 17  Temp:      General: WDWN male.  HEENT:  Normocephalic, no lesions, without obvious abnormality.  Normal external eye and conjunctiva.  Normal TM's external ears. Normal external nose, mucus membranes and septum.  Normal pharynx. Cardiovascular: S1, S2 normal, pulses palpable throughout   Pulmonary: chest clear, no wheezing, rales, normal symmetric air entry Abdomen: soft Extremities: no joint deformities, effusion, or inflammation Musculoskeletal: no joint tenderness, deformity or swelling  Skin: warm and dry, no hyperpigmentation, vitiligo, or suspicious lesions  Neurological Examination:  CN: Pupils are equal and round, minimally reactive to light. Corneal reflexes intact bilaterally. Briefly opens eyes to command. Motor: Winces to noxious stimuli in upper extremities. No posturing Sensation: As above Coordination: He does not perform.   Assessment and recommendations per attending neurologist.  Bruna PotterJamie Aldridge PA-C [facility]  04/18/2017, 3:03 PM Patient seen and examined with Bruna PotterJamie Aldridge PA-C. Agree with the exam as above.  EEG: Interpretation: This EEG is indicative of a severe diffuse encephalopathy, but non-specific as to etiology. Sedating medication effect is suspected.  No seizures, epileptiform discharges, focal or lateralizing signs are seen.    Impression: Patient is a 26 year old man who was brought in with hypoxic brain  injury who was started on hypothermia protocol for his hypoxic brain injury. Today on exam, he is currently on sedation and paralytics, but was able to open eyes to command although there was no posturing or motor response to noxious stimulus pressure again which might be due to the sedation and paralytics.   Hypoxic/ischemic brain injury  -  currently on hypothermia protocol  Recommendations: Continue with continuous EEG Temperature management per critical care medicine We will evaluate him tomorrow and no prognostication can be done at least 72 hours after rewarming. Please call with questions  Milon DikesAshish Alyssandra Hulsebus, MD [facility] [phone]  If 7pm to 7am, please call on call as listed on AMION.

## 2017-04-18 NOTE — Progress Notes (Signed)
  Echocardiogram 2D Echocardiogram has been performed.  [name], Mitchell Snyder 04/18/2017, 12:02 PM

## 2017-04-18 NOTE — Progress Notes (Signed)
Troponin at 2332 was 22.36; RN not called by lab. Attending MD aware of pervious increase in troponin, orders placed to have cardiac echo completed this AM. Critical EKG result this am which reflects pervious results known to Attending MD. Will pass information on to the AM nurse.

## 2017-04-18 NOTE — Clinical Social Work Note (Signed)
Clinical Social Worker available to patient family as needed.  Patient remains on the ventilator at this time and not appropriate for SBIRT completion.  CSW remains available as needed.  Macario GoldsJesse Darriel Utter, [place] [phone]
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
## 2017-04-18 NOTE — Progress Notes (Signed)
EKG CRITICAL VALUE     12 lead EKG performed.  Critical valueKaylee, RN notified.   Yobana Culliton L, CCT 04/18/2017 6:55 AM

## 2017-04-18 NOTE — Progress Notes (Signed)
eLink Physician-Brief Progress Note Patient Name: Fredia BeetsDuane Serio DOB: 07-Feb-1991 MRN: [id number]   Date of Service  04/18/2017  HPI/Events of Note    eICU Interventions  Hypokalemia -repleted      Intervention Category Intermediate Interventions: Electrolyte abnormality - evaluation and management  Nicholis Stepanek V. 04/18/2017, 6:39 AM
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
## 2017-04-18 NOTE — Progress Notes (Signed)
CRITICAL VALUE ALERT  Critical Value:  Troponin 18.86  Date & Time Notied:  04/18/2017 2100  Provider Notified: Lindie SpruceWyatt MD  Orders Received/Actions taken: Orders placed to have echo completed
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
## 2017-04-19 ENCOUNTER — Encounter (HOSPITAL_COMMUNITY): Payer: Self-pay

## 2017-04-19 ENCOUNTER — Inpatient Hospital Stay (HOSPITAL_COMMUNITY): Payer: 59

## 2017-04-19 DIAGNOSIS — S27322A Contusion of lung, bilateral, initial encounter: Secondary | ICD-10-CM

## 2017-04-19 DIAGNOSIS — S299XXA Unspecified injury of thorax, initial encounter: Secondary | ICD-10-CM

## 2017-04-19 DIAGNOSIS — T17908A Unspecified foreign body in respiratory tract, part unspecified causing other injury, initial encounter: Secondary | ICD-10-CM

## 2017-04-19 LAB — BASIC METABOLIC PANEL
ANION GAP: 8 (ref 5–15)
Anion gap: 7 (ref 5–15)
Anion gap: 7 (ref 5–15)
BUN: 8 mg/dL (ref 6–20)
BUN: 8 mg/dL (ref 6–20)
BUN: 9 mg/dL (ref 6–20)
CALCIUM: 8.7 mg/dL — AB (ref 8.9–10.3)
CALCIUM: 8.6 mg/dL — AB (ref 8.9–10.3)
CHLORIDE: 109 mmol/L (ref 101–111)
CO2: 23 mmol/L (ref 22–32)
CO2: 24 mmol/L (ref 22–32)
CO2: 23 mmol/L (ref 22–32)
CREATININE: 1.02 mg/dL (ref 0.61–1.24)
CREATININE: 1.08 mg/dL (ref 0.61–1.24)
Calcium: 8.7 mg/dL — ABNORMAL LOW (ref 8.9–10.3)
Chloride: 110 mmol/L (ref 101–111)
Chloride: 110 mmol/L (ref 101–111)
Creatinine, Ser: 1.19 mg/dL (ref 0.61–1.24)
GFR calc non Af Amer: >60 mL/min (ref >60)
GFR calc non Af Amer: >60 mL/min (ref >60)
GFR calc non Af Amer: >60 mL/min (ref >60)
GLUCOSE: 91 mg/dL (ref 65–99)
GLUCOSE: 103 mg/dL — AB (ref 65–99)
Glucose, Bld: 87 mg/dL (ref 65–99)
POTASSIUM: 3.7 mmol/L (ref 3.5–5.1)
Potassium: 3.7 mmol/L (ref 3.5–5.1)
Potassium: 3.7 mmol/L (ref 3.5–5.1)
Sodium: 140 mmol/L (ref 135–145)
Sodium: 140 mmol/L (ref 135–145)
Sodium: 141 mmol/L (ref 135–145)

## 2017-04-19 LAB — GLUCOSE, CAPILLARY
GLUCOSE-CAPILLARY: 86 mg/dL (ref 65–99)
GLUCOSE-CAPILLARY: 74 mg/dL (ref 65–99)
GLUCOSE-CAPILLARY: 79 mg/dL (ref 65–99)
GLUCOSE-CAPILLARY: 91 mg/dL (ref 65–99)
Glucose-Capillary: 77 mg/dL (ref 65–99)
Glucose-Capillary: 78 mg/dL (ref 65–99)

## 2017-04-19 LAB — CBC
HEMATOCRIT: 39.8 % (ref 39.0–52.0)
HEMOGLOBIN: 13.1 g/dL (ref 13.0–17.0)
MCH: 27.6 pg (ref 26.0–34.0)
MCHC: 32.9 g/dL (ref 30.0–36.0)
MCV: 83.8 fL (ref 78.0–100.0)
Platelets: 250 10*3/uL (ref 150–400)
RBC: 4.75 MIL/uL (ref 4.22–5.81)
RDW: 12.9 % (ref 11.5–15.5)
WBC: 5.3 10*3/uL (ref 4.0–10.5)

## 2017-04-19 LAB — CK: CK TOTAL: 662 U/L — AB (ref 49–397)

## 2017-04-19 MED ORDER — FENTANYL CITRATE (PF) 100 MCG/2ML IJ SOLN: 12.5000 ug | INTRAMUSCULAR | Status: DC | PRN

## 2017-04-19 MED ORDER — FUROSEMIDE 10 MG/ML IJ SOLN
INTRAMUSCULAR | Status: AC
  Administered 2017-04-19: 40 mg via INTRAVENOUS
  Filled 2017-04-19: qty 4

## 2017-04-19 MED ORDER — FUROSEMIDE 10 MG/ML IJ SOLN
40.0000 mg | Freq: Once | INTRAMUSCULAR | Status: AC
  Administered 2017-04-19: 40 mg via INTRAVENOUS

## 2017-04-19 MED ORDER — DEXAMETHASONE SODIUM PHOSPHATE 4 MG/ML IJ SOLN
4.0000 mg | Freq: Once | INTRAMUSCULAR | Status: AC
  Administered 2017-04-19: 4 mg via INTRAVENOUS
  Filled 2017-04-19: qty 1

## 2017-04-19 MED ORDER — DEXAMETHASONE SODIUM PHOSPHATE 10 MG/ML IJ SOLN
4.0000 mg | Freq: Once | INTRAMUSCULAR | Status: DC
  Filled 2017-04-19: qty 0.4

## 2017-04-19 MED ORDER — SODIUM CHLORIDE 0.9 % IV BOLUS (SEPSIS)
1000.0000 mL | Freq: Once | INTRAVENOUS | Status: AC
  Administered 2017-04-19: 1000 mL via INTRAVENOUS

## 2017-04-19 MED ORDER — ORAL CARE MOUTH RINSE: 15.0000 mL | Freq: Two times a day (BID) | OROMUCOSAL | Status: DC

## 2017-04-19 MED ORDER — CHLORHEXIDINE GLUCONATE 0.12 % MT SOLN
15.0000 mL | Freq: Two times a day (BID) | OROMUCOSAL | Status: DC
  Administered 2017-04-19: 15 mL via OROMUCOSAL
  Filled 2017-04-19: qty 15
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
## 2017-04-19 NOTE — Progress Notes (Signed)
[facility]  Called to bedside to assess for extubation. Passing SBT for over an hour Initially felt to have "minimal" cuff leak, so I ordered lasix given facial swelling and decadron. However on my review of the patient he had a robust cuff leak, following commands. Plan: Extubate Remove temp pads Remove femoral line  Heber CarolinaBrent McQuaid, MD [place] [facility] Pager: [phone] Cell: [phone] After 3pm or if no response, call [phone]
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
## 2017-04-19 NOTE — Procedures (Signed)
Extubation Procedure Note  Patient Details:   Name: Mitchell Snyder DOB: Oct 08, 1991 MRN: [id number]   Airway Documentation:     Evaluation  O2 sats: stable throughout Complications: No apparent complications Patient did tolerate procedure well. Bilateral Breath Sounds: Diminished   Yes   Pt extubated to 4L N/C.  No stridor noted.  RN @ bedside.  Christophe LouisSteven D Letti Towell 04/19/2017, 1:30 PM

## 2017-04-19 NOTE — Progress Notes (Signed)
AT 04/18/17 2350 pt started rewarming; tempeture programing verified by Devota PaceKaylee Na Waldrip RN and Marissa NestleKatyln Stanly RN.

## 2017-04-19 NOTE — Progress Notes (Signed)
Wasted 200 mL Fentanyl gtt and 40 mL Versed gtt with [name] RN
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
## 2017-04-20 ENCOUNTER — Inpatient Hospital Stay (HOSPITAL_COMMUNITY): Payer: 59

## 2017-04-20 DIAGNOSIS — J690 Pneumonitis due to inhalation of food and vomit: Secondary | ICD-10-CM

## 2017-04-20 LAB — CBC
HCT: 36.6 % — ABNORMAL LOW (ref 39.0–52.0)
Hemoglobin: 12.2 g/dL — ABNORMAL LOW (ref 13.0–17.0)
MCH: 28.2 pg (ref 26.0–34.0)
MCHC: 33.3 g/dL (ref 30.0–36.0)
MCV: 84.5 fL (ref 78.0–100.0)
PLATELETS: 271 10*3/uL (ref 150–400)
RBC: 4.33 MIL/uL (ref 4.22–5.81)
RDW: 12.8 % (ref 11.5–15.5)
WBC: 11.3 10*3/uL — AB (ref 4.0–10.5)

## 2017-04-20 LAB — GLUCOSE, CAPILLARY
GLUCOSE-CAPILLARY: 72 mg/dL (ref 65–99)
Glucose-Capillary: 90 mg/dL (ref 65–99)
Glucose-Capillary: 97 mg/dL (ref 65–99)
Glucose-Capillary: 117 mg/dL — ABNORMAL HIGH (ref 65–99)

## 2017-04-20 LAB — BASIC METABOLIC PANEL
ANION GAP: 13 (ref 5–15)
BUN: 7 mg/dL (ref 6–20)
CALCIUM: 8.7 mg/dL — AB (ref 8.9–10.3)
CO2: 21 mmol/L — AB (ref 22–32)
CREATININE: 1.2 mg/dL (ref 0.61–1.24)
Chloride: 106 mmol/L (ref 101–111)
GFR calc Af Amer: >60 mL/min (ref >60)
Glucose, Bld: 112 mg/dL — ABNORMAL HIGH (ref 65–99)
Potassium: 4 mmol/L (ref 3.5–5.1)
SODIUM: 140 mmol/L (ref 135–145)

## 2017-04-20 LAB — CK: CK TOTAL: 575 U/L — AB (ref 49–397)

## 2017-04-20 MED ORDER — AMOXICILLIN-POT CLAVULANATE 875-125 MG PO TABS
1.0000 | ORAL_TABLET | Freq: Two times a day (BID) | ORAL | Status: DC
  Administered 2017-04-20 – 2017-04-21 (×3): 1 via ORAL
  Filled 2017-04-20 (×3): qty 1

## 2017-04-20 MED ORDER — SODIUM CHLORIDE 0.9% FLUSH: 3.0000 mL | INTRAVENOUS | Status: DC | PRN

## 2017-04-20 MED ORDER — SODIUM CHLORIDE 0.9% FLUSH: 3.0000 mL | Freq: Two times a day (BID) | INTRAVENOUS | Status: DC

## 2017-04-20 MED ORDER — DEXAMETHASONE SODIUM PHOSPHATE 4 MG/ML IJ SOLN: 4.0000 mg | Freq: Once | INTRAMUSCULAR | Status: DC

## 2017-04-20 MED ORDER — SODIUM CHLORIDE 0.9 % IV SOLN: 250.0000 mL | INTRAVENOUS | Status: DC | PRN

## 2017-04-20 MED ORDER — FUROSEMIDE 10 MG/ML IJ SOLN: 40.0000 mg | Freq: Once | INTRAMUSCULAR | Status: DC

## 2017-04-20 MED ORDER — SODIUM CHLORIDE 0.9 % IV BOLUS (SEPSIS): 1000.0000 mL | Freq: Once | INTRAVENOUS | Status: DC

## 2017-04-20 MED ORDER — OXYCODONE HCL 5 MG PO TABS
5.0000 mg | ORAL_TABLET | ORAL | Status: DC | PRN
  Administered 2017-04-20 (×2): 5 mg via ORAL
  Filled 2017-04-20 (×4): qty 1

## 2017-04-20 NOTE — Evaluation (Signed)
Physical Therapy Evaluation Patient Details Name: Mitchell Snyder MRN: [id number] DOB: 1991-03-21 Today's Date: 04/20/2017   History of Present Illness  Pt is a 26 y/o male admitted secondary to crush injury of chest and anoxic encephalopathy. Pt sustained after car jack broke and car fell on top of him. CT of the head: Preservation of gray - white compartment differentiation. CT of chest revealed tiny apical superior pneumothorax and was suspicious for aspiration. No significant PMH.   Clinical Impression  Pt admitted secondary to problem above with deficits below. PTA, pt was independent with functional mobility. Upon evaluation, pt with decreased balance and pain in back of the head. Pt reports he feels more off balance than normal. Requiring min guard for safety throughout ambulation. No equipment or follow up PT needs. Would benefit from acute higher level balance tasks to ensure safety with mobility. Will continue to follow acutely.     Follow Up Recommendations No PT follow up;Supervision for mobility/OOB    Equipment Recommendations  None recommended by PT    Recommendations for Other Services       Precautions / Restrictions Precautions Precautions: None Restrictions Weight Bearing Restrictions: No      Mobility  Bed Mobility Overal bed mobility: Independent             General bed mobility comments: No assist required.   Transfers Overall transfer level: Needs assistance Equipment used: None Transfers: Sit to/from Stand Sit to Stand: Min guard         General transfer comment: Min guard for safety.   Ambulation/Gait Ambulation/Gait assistance: Min guard Ambulation Distance (Feet): 250 Feet Assistive device: None Gait Pattern/deviations: Step-through pattern;Decreased stride length Gait velocity: Decreased Gait velocity interpretation: Below normal speed for age/gender General Gait Details: Slow, guarded gait. Mild instability, however, no overt LOB  noted. Able to perform horizontal and vertical head turns with no LOB. Pt reports feeling somewhat off balance compared to normal   Stairs Stairs: Yes Stairs assistance: Min guard Stair Management: One rail Right;Step to pattern;Alternating pattern;Forwards (HHA) Number of Stairs: 10 General stair comments: Educated about step to pattern to increase safety with stair navigation. Use of step through for ascending steps and step to for descending. Min guard for safety. Educated about assist required at home.   Wheelchair Mobility    Modified Rankin (Stroke Patients Only)       Balance Overall balance assessment: Needs assistance Sitting-balance support: No upper extremity supported;Feet supported Sitting balance-Leahy Scale: Good     Standing balance support: No upper extremity supported;During functional activity Standing balance-Leahy Scale: Fair                               Pertinent Vitals/Pain Pain Assessment: 0-10 Pain Score: 2  Pain Location: back of head  Pain Descriptors / Indicators: Tender;Aching Pain Intervention(s): Limited activity within patient's tolerance;Monitored during session;Repositioned    Home Living Family/patient expects to be discharged to:: Private residence Living Arrangements: Spouse/significant other Available Help at Discharge: Family;Available PRN/intermittently (can go back to [place] if need be. ) Type of Home: Apartment Home Access: Stairs to enter Entrance Stairs-Rails: Right;Left;Can reach both Entrance Stairs-Number of Steps: 3 flights  Home Layout: One level Home Equipment: None      Prior Function Level of Independence: Independent               Hand Dominance   Dominant Hand: Right    Extremity/Trunk Assessment  Upper Extremity Assessment Upper Extremity Assessment: Overall WFL for tasks assessed    Lower Extremity Assessment Lower Extremity Assessment: Overall WFL for tasks assessed    Cervical  / Trunk Assessment Cervical / Trunk Assessment: Normal  Communication   Communication: No difficulties  Cognition Arousal/Alertness: Awake/alert Behavior During Therapy: WFL for tasks assessed/performed Overall Cognitive Status: Impaired/Different from baseline                                 General Comments: Per speech notes deficits noted in higher level concentration and working memory. See speech notes for further details.       General Comments      Exercises     Assessment/Plan    PT Assessment Patient needs continued PT services  PT Problem List Decreased balance;Decreased mobility;Decreased cognition;Decreased knowledge of use of DME;Pain       PT Treatment Interventions DME instruction;Gait training;Stair training;Therapeutic activities;Functional mobility training;Therapeutic exercise;Balance training;Neuromuscular re-education;Patient/family education    PT Goals (Current goals can be found in the Care Plan section)  Acute Rehab PT Goals Patient Stated Goal: to go home  PT Goal Formulation: With patient Time For Goal Achievement: 04/27/17 Potential to Achieve Goals: Good    Frequency Min 3X/week   Barriers to discharge        Co-evaluation               AM-PAC PT "6 Clicks" Daily Activity  Outcome Measure Difficulty turning over in bed (including adjusting bedclothes, sheets and blankets)?: None Difficulty moving from lying on back to sitting on the side of the bed? : None Difficulty sitting down on and standing up from a chair with arms (e.g., wheelchair, bedside commode, etc,.)?: None Help needed moving to and from a bed to chair (including a wheelchair)?: A Little Help needed walking in hospital room?: A Little Help needed climbing 3-5 steps with a railing? : A Little 6 Click Score: 21    End of Session Equipment Utilized During Treatment: Gait belt Activity Tolerance: Patient tolerated treatment well Patient left: in bed;with  call bell/phone within reach Nurse Communication: Mobility status PT Visit Diagnosis: Unsteadiness on feet (R26.81)    Time: 1610-9604 PT Time Calculation (min) (ACUTE ONLY): 17 min   Charges:   PT Evaluation $PT Eval Moderate Complexity: 1 Procedure     PT G Codes:        Gladys Damme, PT, DPT  Acute Rehabilitation Services  Pager: [phone]   Lehman Prom 04/20/2017, 3:13 PM

## 2017-04-20 NOTE — Progress Notes (Signed)
Trauma Service Note  Subjective: Patient is doing great.  Completely alert and awake and oriented.  No distress.  Has pain on the back of his head  Objective: Vital signs in last 24 hours: Temp:  [95.4 F (35.2 C)-100.8 F (38.2 C)] 99.7 F (37.6 C) (07/11 0400) Pulse Rate:  [57-126] 80 (07/11 0700) Resp:  [15-30] 25 (07/11 0700) BP: (110-158)/(45-86) 134/66 (07/11 0700) SpO2:  [93 %-100 %] 97 % (07/11 0700) FiO2 (%):  [30 %] 30 % (07/10 1200) Weight:  [86.3 kg (190 lb 4.1 oz)] 86.3 kg (190 lb 4.1 oz) (07/11 0500) Last BM Date:  (unable to assess )  Intake/Output from previous day: 07/10 0701 - 07/11 0700 In: 2463.4 [I.V.:1963.4; IV Piggyback:500] Out: 4000 [Urine:4000] Intake/Output this shift: No intake/output data recorded.  General: No distress  Lungs: clear to auscultation  Abd: Soft, good bowel sounds.  Extremities: No changes  Neuro: Intact  Lab Results: CBC   Recent Labs  04/19/17 0402 04/20/17 0252  WBC 5.3 11.3*  HGB 13.1 12.2*  HCT 39.8 36.6*  PLT 250 271   BMET  Recent Labs  04/19/17 0751 04/20/17 0252  NA 140 140  K 3.7 4.0  CL 109 106  CO2 23 21*  GLUCOSE 87 112*  BUN 9 7  CREATININE 1.19 1.20  CALCIUM 8.7* 8.7*   PT/INR  Recent Labs  04/17/17 2022 04/18/17 0157  LABPROT 14.2 14.6  INR 1.10 1.13   ABG  Recent Labs  04/18/17 0440 04/18/17 1110  PHART 7.500* 7.447  HCO3 22.3 22.6    Studies/Results: Dg Chest Port 1 View  Result Date: 04/19/2017 CLINICAL DATA:  Trauma.  Endotracheal tube EXAM: PORTABLE CHEST 1 VIEW COMPARISON:  Yesterday FINDINGS: Endotracheal tube tip at the clavicular heads. An orogastric tube reaches the stomach at least. Mild haziness of the bilateral chest from atelectasis, improved since admission radiography. No visible pneumothorax or pleural effusion. IMPRESSION: 1. Stable, unremarkable tube positioning. 2. Mild atelectasis. Electronically Signed   By: Marnee SpringJonathon  Watts M.D.   On: 04/19/2017 08:09     Anti-infectives: Anti-infectives    Start     Dose/Rate Route Frequency Ordered Stop   04/20/17 1000  amoxicillin-clavulanate (AUGMENTIN) 875-125 MG per tablet 1 tablet     1 tablet Oral Every 12 hours 04/20/17 0737 04/23/17 0959   04/17/17 2000  Ampicillin-Sulbactam (UNASYN) 3 g in sodium chloride 0.9 % 100 mL IVPB  Status:  Discontinued     3 g 200 mL/hr over 30 Minutes Intravenous Every 6 hours 04/17/17 1857 04/20/17 0737   04/17/17 1800  cefTRIAXone (ROCEPHIN) 1 g in dextrose 5 % 50 mL IVPB  Status:  Discontinued     1 g 100 mL/hr over 30 Minutes Intravenous Every 24 hours 04/17/17 1739 04/17/17 1857      Assessment/Plan: s/p  Advance diet Transfer to floorPT and ST consultations.  This patient has been seen and I agree with the findings and treatment plan.  Marta LamasJames O. Gae BonWyatt, III, MD, FACS [phone] (pager) [phone] (direct pager) Trauma Surgeon  LOS: 3 days   Marta LamasJames O. Gae BonWyatt, III, MD, FACS [phone] Trauma Surgeon 04/20/2017

## 2017-04-20 NOTE — Progress Notes (Signed)
PULMONARY / CRITICAL CARE MEDICINE   Name: Mitchell Snyder MRN: [id number] DOB: [birth year]    ADMISSION DATE:  04/17/2017 CONSULTATION DATE:  04/17/17  REFERRING MD:  Dr Lindie Spruce  CHIEF COMPLAINT:  Chest wall injury after getting crushed under car rotor due to jack coming off  HISTORY OF PRESENT ILLNESS:  Patient was working under the car on the brake rotors. Somehow the car came off the jack and landed on the patient's chest causing a crush type injury.  When EMS arrived patient was unresponsive but never lost pulse. He was admitted to the trauma service, and amazingly did not suffer any serious musculoskeletal injury. However there was concern for pulmonary contusion and hypoxic brain injury. Neurology was consulted and recommended TTM therapy. PCCM consulted for further evaluation.  Subjective/Interval:  Extubated yesterday. Doing great.   VITAL SIGNS: BP 134/66   Pulse 80   Temp 99.7 F (37.6 C) (Core (Comment))   Resp (!) 25   Ht 5\' 11"  (1.803 m)   Wt 86.3 kg (190 lb 4.1 oz)   SpO2 97%   BMI 26.54 kg/m   HEMODYNAMICS: CVP:  [4 mmHg-5 mmHg] 5 mmHg  VENTILATOR SETTINGS: Vent Mode: CPAP;PSV FiO2 (%):  [30 %] 30 % PEEP:  [5 cmH20] 5 cmH20 Pressure Support:  [5 cmH20-10 cmH20] 5 cmH20 Plateau Pressure:  [13 cmH20] 13 cmH20  INTAKE / OUTPUT: I/O last 3 completed shifts: In: 3307 [I.V.:2557; IV Piggyback:750] Out: 4090 [Urine:4090]  PHYSICAL EXAMINATION:   General: young male in NAD Neuro: alert oriented does not remember events of incident HEENT:  Injury improving. Color returning to baseline. PERRL. Some ruptured vessels bilat eyes Cardiovascular:  RRR, no MRG Lungs:  Clear to auscultation bilaterally Abdomen:  Soft, nondistended, bowel sounds present Musculoskeletal:  NO acute deformity no ROM limitation Skin:  Abrasion appearing injury to chest improving with color returning to baseline.  LABS:  BMET  Recent Labs Lab 04/19/17 0402 04/19/17 0751 04/20/17 0252  NA  141 140 140  K 3.7 3.7 4.0  CL 110 109 106  CO2 24 23 21*  BUN 8 9 7   CREATININE 1.08 1.19 1.20  GLUCOSE 103* 87 112*    Electrolytes  Recent Labs Lab 04/18/17 0434  04/19/17 0402 04/19/17 0751 04/20/17 0252  CALCIUM  --   < > 8.6* 8.7* 8.7*  MG 2.1  --   --   --   --   PHOS 3.0  --   --   --   --   < > = values in this interval not displayed.  CBC  Recent Labs Lab 04/18/17 0434 04/19/17 0402 04/20/17 0252  WBC 13.0* 5.3 11.3*  HGB 13.7 13.1 12.2*  HCT 40.7 39.8 36.6*  PLT 274 250 271    Coag's  Recent Labs Lab 04/17/17 1551 04/17/17 2022 04/18/17 0157  APTT  --  23* 30  INR 1.09 1.10 1.13    Sepsis Markers No results for input(s): LATICACIDVEN, PROCALCITON, O2SATVEN in the last 168 hours.  ABG  Recent Labs Lab 04/18/17 0025 04/18/17 0440 04/18/17 1110  PHART 7.513* 7.500* 7.447  PCO2ART 28.6* 27.7* 31.4*  PO2ART 278.0* 129* 147*    Liver Enzymes  Recent Labs Lab 04/17/17 1551  AST 91*  ALT 64*  ALKPHOS 50  BILITOT 0.6  ALBUMIN 4.2    Cardiac Enzymes  Recent Labs Lab 04/17/17 2332 04/18/17 0634 04/18/17 1200  TROPONINI 22.36* 10.95* 5.60*    Glucose  Recent Labs Lab 04/18/17 2354 04/19/17 0423  04/19/17 0755 04/19/17 1138 04/19/17 1946 04/19/17 2331  GLUCAP 78 86 74 79 91 77    Imaging No results found.   STUDIES:  CT chest, spine, head reviewed Echo 7/9 > LVEF 50-55%   CULTURES: Blood cx 7/8 >  ANTIBIOTICS: Rocephin in ED Unasyn 7/8 > 7/11 Levaquin 7/11-7/14    LINES/TUBES: ETT 7/8 >7/10  ASSESSMENT / PLAN:   Resolved Problems: -Troponin elevation secondary to crush injury of chest. Seems to have peaked.  -Chest wall injury -Shock related to sedatives vs hypovolemia  -AKI  -Concern for hypoxic/anoxic injury  Current Problems: Aspiration pneumonia Pulmonary contusion possible - Agree transition antibiotics to PO augmentin and complete total 7 days. Last day 7/14 - Incentive spirometry to  prevent ATX from splinting.   Primary service planning transfer out of ICU today and discahrge within next 24 possibly later this afternoon. PCCM will sign off.   Joneen RoachPaul Keelia Graybill, AGACNP-BC [facility] Pager [phone] or [phone]  04/20/2017 7:51 AM

## 2017-04-20 NOTE — Evaluation (Signed)
l Speech Language Pathology Evaluation Patient Details Name: Fredia BeetsDuane Beals MRN: [id number] DOB: 06-29-1991 Today's Date: 04/20/2017 Time: [id number] SLP Time Calculation (min) (ACUTE ONLY): 20 min  Problem List:  Patient Active Problem List   Diagnosis Date Noted  . Aspiration into airway   . Anoxic encephalopathy (HCC) 04/17/2017  . Chest wall injury, initial encounter 04/17/2017  . Hemoptysis   . Pulmonary contusion    Past Medical History: History reviewed. No pertinent past medical history. Past Surgical History: No past surgical history on file. HPI:  26 year old male was working on his car when the jack gave way and the brake drum fell on his chest causing suffocation; brought in as level one trauma, primary source of injury hypoxia. Intubated 7/8-10, chest wall injury, minimal musculoskeletal injury, hypothermia protocol for anoxic brain injury.    Assessment / Plan / Recommendation Clinical Impression  Pt presents with mild deficits in higher-level concentration (alternating and divided attention) as well as deficits in working memory specific to retrieval of auditory information.  Judgment, problem solving, language are intact.  Given pt's level of independence PTA and youth, recommend OP SLP for further evaluation and cognitive intervention if warranted.  Pt agrees with plan.      SLP Assessment  SLP Recommendation/Assessment: All further Speech Lanaguage Pathology  needs can be addressed in the next venue of care SLP Visit Diagnosis: Cognitive communication deficit (R41.841)    Follow Up Recommendations  Outpatient SLP    Frequency and Duration           SLP Evaluation Cognition  Overall Cognitive Status: Impaired/Different from baseline Arousal/Alertness: Awake/alert Orientation Level: Oriented to person;Oriented to place;Oriented to situation;Disoriented to time Attention: Alternating Alternating Attention: Impaired Alternating Attention Impairment: Verbal  basic Memory: Impaired Memory Impairment: Retrieval deficit;Decreased short term memory Decreased Short Term Memory: Verbal complex Awareness: Appears intact Problem Solving: Appears intact Safety/Judgment: Appears intact       Comprehension  Auditory Comprehension Overall Auditory Comprehension: Appears within functional limits for tasks assessed Reading Comprehension Reading Status: Not tested    Expression Expression Primary Mode of Expression: Verbal Verbal Expression Overall Verbal Expression: Appears within functional limits for tasks assessed   Oral / Motor  Oral Motor/Sensory Function Overall Oral Motor/Sensory Function: Within functional limits Motor Speech Overall Motor Speech: Appears within functional limits for tasks assessed   GO                    Blenda MountsCouture, Mekhai Venuto Laurice 04/20/2017, 11:44 AM  Marchelle FolksAmanda L. Samson Fredericouture, [place]MA CCC/SLP Pager [phone]

## 2017-04-20 NOTE — Progress Notes (Addendum)
Subjective: Alert and oriented sitting up in bed. Extubated. Feeling well except for some pain on the back of his head. Mom at bedside.  Current Pertinent Medications: None  Pertinent Labs/Diagnostics: CT head 7/8: Preservation of gray - white compartment differentiation. No mass, hemorrhage, or edema. No extra-axial fluid collection or midline shift. CT c-spine 7/8: No fracture or spondylolisthesis.  Physical Examination: Vitals:   04/20/17 0700 04/20/17 0900  BP: 134/66 129/65  Pulse: 80 86  Resp: (!) 25 17  Temp:      General: WDWN male.  HEENT:  Normocephalic, no lesions, without obvious abnormality.  Normal external eye with conjunctival hemorrhages bilaterally.  Normal TM's external ears. Normal external nose, mucus membranes and septum.  Normal pharynx. Cardiovascular: S1, S2 normal, pulses palpable throughout   Pulmonary: chest clear, no wheezing, rales, normal symmetric air entry Abdomen: soft, non-tender Extremities: no joint deformities, effusion, or inflammation Musculoskeletal: no joint tenderness, deformity or swelling Tone and bulk normal throughout; no atrophy noted Skin: warm and dry, no hyperpigmentation, vitiligo, or suspicious lesions  Neurological Examination:  CN: Pupils are equal and round. They are symmetrically reactive from 3-->2 mm. EOMI without nystagmus. Facial sensation is intact to light touch. Face is symmetric at rest with normal strength and mobility. Hearing is intact to conversational voice. Palate elevates symmetrically and uvula is midline. Voice is normal in tone, pitch and quality. Bilateral SCM and trapezii are 5/5. Tongue is midline with normal bulk and mobility.  Motor: Normal bulk, tone, and strength. 5/5 throughout. No drift.  Sensation: Intact to light touch.  DTRs: 2+, symmetric  Toes downgoing bilaterally. No pathologic reflexes.  Coordination: No ataxia in movements   Impression:  26 year old male 24 hours s/p cooling doing  remarkably well. From a neurological standpoint, he is completely intact. Spoke to patient and family and warned of some foggy memory possible in the future. Suggested outpatient MRI to establish new baseline post injury and follow up with an outpatient neurologist.  Recommendations: 1) Supportive care as necessary 2) Continue with recommendations per other services.  3) Please call neurohospitalists for any further questions.  Bruna PotterJamie Aldridge PA-C [facility]  04/20/2017, 10:06 AM Addendum Patient was extubated yesterday He is doing well. Reports some foggy memory, but no focal motor/senory/visual deficitis. Exam shows a well-developed well-nourished man in no acute distress sitting comfortably in bed eating his breakfast. No cranial nerve deficits.  Full strength in all extremities Normal sensation No dysmetria Recommendations -No further neurological intervention at this time. -Supportive management per critical care/primary team -Follow up with an outpatient neurologist -Outpatient MRI of the brain to establish baseline. Please call with questions  Milon DikesAshish Nessie Nong, MD [facility] [phone]  If 7pm to 7am, please call on call as listed on AMION.

## 2017-04-21 ENCOUNTER — Encounter (HOSPITAL_COMMUNITY): Payer: Self-pay

## 2017-04-21 MED ORDER — AMOXICILLIN-POT CLAVULANATE 875-125 MG PO TABS: 1.0000 | ORAL_TABLET | Freq: Two times a day (BID) | ORAL | 0 refills | Status: AC

## 2017-04-21 MED ORDER — OXYCODONE HCL 5 MG PO TABS: 5.0000 mg | ORAL_TABLET | ORAL | 0 refills | Status: DC | PRN

## 2017-04-21 NOTE — Progress Notes (Signed)
Mitchell Snyder to be D/C'd  per MD order. Discussed with the patient and all questions fully answered.  VSS, Skin clean, dry and intact without evidence of skin break down, no evidence of skin tears noted.  IV catheter discontinued intact. Site without signs and symptoms of complications. Dressing and pressure applied.  An After Visit Summary was printed and given to the patient. Patient received prescription.  D/c education completed with patient/family including follow up instructions, medication list, d/c activities limitations if indicated, with other d/c instructions as indicated by MD - patient able to verbalize understanding, all questions fully answered.   Patient instructed to return to ED, call 911, or call MD for any changes in condition.   Patient declined WC transport, wants to walk out, and D/C home via private auto.
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
## 2017-04-21 NOTE — Discharge Instructions (Signed)
Call to schedule an appointment with a neurologist to be seen within 2 weeks of discharge. You will need to get an MRI.   Call our trauma office with any questions or concerns.

## 2017-04-22 LAB — CULTURE, BLOOD (ROUTINE X 2)
CULTURE: NO GROWTH
CULTURE: NO GROWTH
SPECIAL REQUESTS: ADEQUATE
SPECIAL REQUESTS: ADEQUATE

## 2017-05-02 ENCOUNTER — Ambulatory Visit (INDEPENDENT_AMBULATORY_CARE_PROVIDER_SITE_OTHER): Payer: 59 | Admitting: Neurology

## 2017-05-02 ENCOUNTER — Encounter: Payer: Self-pay | Admitting: Neurology

## 2017-05-02 VITALS — BP 131/72 | HR 57 | Ht 68.0 in | Wt 187.5 lb

## 2017-05-02 DIAGNOSIS — G931 Anoxic brain damage, not elsewhere classified: Secondary | ICD-10-CM
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
## 2017-05-18 ENCOUNTER — Encounter: Payer: Self-pay | Admitting: Physical Therapy

## 2017-05-18 ENCOUNTER — Ambulatory Visit: Payer: 59 | Attending: Family Medicine | Admitting: Physical Therapy

## 2017-05-18 ENCOUNTER — Telehealth: Payer: Self-pay | Admitting: Neurology

## 2017-05-18 ENCOUNTER — Encounter: Payer: Self-pay | Admitting: Neurology

## 2017-05-18 DIAGNOSIS — M25511 Pain in right shoulder: Secondary | ICD-10-CM | POA: Diagnosis present

## 2017-05-18 DIAGNOSIS — M25612 Stiffness of left shoulder, not elsewhere classified: Secondary | ICD-10-CM | POA: Diagnosis present

## 2017-05-18 DIAGNOSIS — M25611 Stiffness of right shoulder, not elsewhere classified: Secondary | ICD-10-CM | POA: Insufficient documentation

## 2017-05-18 DIAGNOSIS — M25512 Pain in left shoulder: Secondary | ICD-10-CM | POA: Diagnosis present
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
## 2017-05-18 NOTE — Telephone Encounter (Signed)
Patient requesting letter stating he is medical released to be able to fly on a plane. He is in the [employer]. His best # [phone]

## 2017-05-18 NOTE — Telephone Encounter (Signed)
I called patient. The patient indicates that he is feeling much better, headaches are gone in his memory is now essentially normal.  I'll dictate a letter indicating that he is able to fly on an airplane, he indicates that [employer] also needs our medical records.

## 2017-05-19 NOTE — Telephone Encounter (Signed)
Placed signed letter up front with office note as requested. Included release form for patient to sign to get office note.

## 2017-05-23 ENCOUNTER — Ambulatory Visit: Payer: 59 | Admitting: Physical Therapy

## 2017-05-23 ENCOUNTER — Encounter: Payer: Self-pay | Admitting: Physical Therapy

## 2017-05-23 DIAGNOSIS — M25511 Pain in right shoulder: Secondary | ICD-10-CM | POA: Diagnosis not present

## 2017-05-23 DIAGNOSIS — M25612 Stiffness of left shoulder, not elsewhere classified: Secondary | ICD-10-CM

## 2017-05-23 DIAGNOSIS — M25512 Pain in left shoulder: Secondary | ICD-10-CM

## 2017-05-23 DIAGNOSIS — M25611 Stiffness of right shoulder, not elsewhere classified: Secondary | ICD-10-CM

## 2017-05-23 NOTE — Therapy (Signed)
[place], [place], [address] Phone: [phone]   Fax:  [phone]  Physical Therapy Treatment  Patient Details  Name: Mitchell Snyder MRN: [id number] Date of Birth: Nov 16, 1990 Referring Provider: Precious Haws  Encounter Date: 05/23/2017      PT End of Session - 05/23/17 1731    Visit Number 2   Date for PT Re-Evaluation 07/18/17   PT Start Time 1655   PT Stop Time 1745   PT Time Calculation (min) 50 min   Activity Tolerance Patient tolerated treatment well   Behavior During Therapy [facility] for tasks assessed/performed      History reviewed. No pertinent past medical history.  Past Surgical History:  Procedure Laterality Date  . WISDOM TOOTH EXTRACTION     x3    There were no vitals filed for this visit.      Subjective Assessment - 05/23/17 1656    Subjective Pt reports that he has been about the same as far as pain level. He has been doing his HEP at home. Said that he lifted a 10 lb pack of soil before coming here and didn't have any pain. Reports that work requirements include 30-40 lb of lifting.    Patient Stated Goals Decrease pain, increase ROM and strength.    Currently in Pain? Yes   Pain Score 1                          [facility] Adult PT Treatment/Exercise - 05/23/17 0001      Exercises   Exercises Shoulder     Shoulder Exercises: Seated   Other Seated Exercises UE Bike R 6 4 min, Constant work 45W 2 min forward and 2 back     Shoulder Exercises: Prone   Other Prone Exercises Serratus push ups 15, 10 sec holds.    Other Prone Exercises Plank ups 20  Less eccentric control with R UE.      Shoulder Exercises: Standing   Other Standing Exercises ER throws 1x15 both arms     Shoulder Exercises: ROM/Strengthening   UBE (Upper Arm Bike) R 6 4 min, constant work 45W 2 min forward 2 backward   Cybex Row 15 reps  2 sets   Cybex Row Limitations 35lb   Other  ROM/Strengthening Exercises Lat pulls 35lb 2x15, Resisted ER 15lb 2x15 both sides   Other ROM/Strengthening Exercises arrow pulls 35 lb 2x15 both UE     Shoulder Exercises: Stretch   Other Shoulder Stretches Doorway stretches - 4 with 20 sec hold.      Shoulder Exercises: Body Blade   Flexion 2 reps;15 seconds  bilateral UE   ABduction 2 reps;15 seconds  bilateral UE   Internal Rotation 2 reps;15 seconds  bilateral UE                  PT Short Term Goals - 05/23/17 1749      PT SHORT TERM GOAL #1   Title Independent with initial HEP.    Time 1   Period Weeks   Status Partially Met           PT Long Term Goals - 05/23/17 1749      PT LONG TERM GOAL #2   Title Able to don and doff his shirt without difficulty or pain greater than 2/10.   Period Weeks   Status Partially Met  Plan - 05/23/17 1733    Clinical Impression Statement Pt did well with exercises today. Had pain increase to 2/10 with lat pulls when he protracted his scapulae at end range, with resisted ER, and with Plank ups in his R shoulder. Otherwise he tolderated the exercises well and reports less pain with his ROM.   PT Treatment/Interventions ADLs/Self Care Home Management;Cryotherapy;Electrical Stimulation;Ultrasound;Moist Heat;Iontophoresis 26m/ml Dexamethasone;Therapeutic activities;Therapeutic exercise;Neuromuscular re-education;Manual techniques;Patient/family education;Passive range of motion;Dry needling   PT Next Visit Plan Work with patient on stretching and strengthening his scapular stabilizers. Modalities for pain if needed.    PT Home Exercise Plan Rows, shoulder extension with red theraband, scapular retraction, doorway stretch, serratus push-up, plank ups, resisted ER, and arrow pulls.    Consulted and Agree with Plan of Care Patient      Patient will benefit from skilled therapeutic intervention in order to improve the following deficits and impairments:   Decreased activity tolerance, Decreased endurance, Decreased range of motion, Decreased strength, Increased muscle spasms, Impaired flexibility, Postural dysfunction, Improper body mechanics, Pain, Impaired UE functional use  Visit Diagnosis: Acute pain of right shoulder  Acute pain of left shoulder  Stiffness of right shoulder, not elsewhere classified  Stiffness of left shoulder, not elsewhere classified     Problem List Patient Active Problem List   Diagnosis Date Noted  . Aspiration into airway   . [place] ([place] 04/17/2017  . Chest wall injury, initial encounter 04/17/2017  . Hemoptysis   . Pulmonary contusion     KLennart Pall SPT 05/23/2017, 5:51 PM  [address] [address]Phone: [phone]  Fax:  [phone] Name: DTravis MastelMRN: [id number] of Birth: 112/29/1992

## 2017-05-26 ENCOUNTER — Ambulatory Visit: Payer: 59 | Admitting: Physical Therapy

## 2017-05-26 ENCOUNTER — Encounter: Payer: Self-pay | Admitting: Physical Therapy

## 2017-05-26 DIAGNOSIS — M25611 Stiffness of right shoulder, not elsewhere classified: Secondary | ICD-10-CM

## 2017-05-26 DIAGNOSIS — M25512 Pain in left shoulder: Secondary | ICD-10-CM

## 2017-05-26 DIAGNOSIS — M25612 Stiffness of left shoulder, not elsewhere classified: Secondary | ICD-10-CM

## 2017-05-26 DIAGNOSIS — M25511 Pain in right shoulder: Secondary | ICD-10-CM | POA: Diagnosis not present
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
## 2017-06-02 ENCOUNTER — Ambulatory Visit: Payer: 59 | Admitting: Physical Therapy

## 2017-06-02 ENCOUNTER — Encounter: Payer: Self-pay | Admitting: Physical Therapy

## 2017-06-02 DIAGNOSIS — M25511 Pain in right shoulder: Secondary | ICD-10-CM | POA: Diagnosis not present

## 2017-06-02 DIAGNOSIS — M25612 Stiffness of left shoulder, not elsewhere classified: Secondary | ICD-10-CM

## 2017-06-02 DIAGNOSIS — M25512 Pain in left shoulder: Secondary | ICD-10-CM

## 2017-06-02 DIAGNOSIS — M25611 Stiffness of right shoulder, not elsewhere classified: Secondary | ICD-10-CM
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
## 2017-08-02 ENCOUNTER — Ambulatory Visit: Payer: 59 | Admitting: Adult Health

## 2017-08-03 ENCOUNTER — Encounter: Payer: Self-pay | Admitting: Adult Health

## 2017-10-13 ENCOUNTER — Ambulatory Visit (INDEPENDENT_AMBULATORY_CARE_PROVIDER_SITE_OTHER): Payer: 59 | Admitting: Adult Health

## 2017-10-13 ENCOUNTER — Encounter: Payer: Self-pay | Admitting: Adult Health

## 2017-10-13 VITALS — BP 118/67 | HR 52 | Ht 69.0 in | Wt 197.0 lb

## 2017-10-13 DIAGNOSIS — R519 Headache, unspecified: Secondary | ICD-10-CM

## 2017-10-13 DIAGNOSIS — G931 Anoxic brain damage, not elsewhere classified: Secondary | ICD-10-CM

## 2017-10-13 DIAGNOSIS — R51 Headache: Secondary | ICD-10-CM

## 2017-10-13 NOTE — Patient Instructions (Signed)
Your Plan:  Continue to monitor symptoms If your symptoms worsen or you develop new symptoms please let us know.   Thank you for coming to see us at [facility]. I hope we have been able to provide you high quality care today.  You may receive a patient satisfaction survey over the next few weeks. We would appreciate your feedback and comments so that we may continue to improve ourselves and the health of our patients.   
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
## 2017-10-25 NOTE — Progress Notes (Signed)
Personally  participated in, made any corrections needed, and agree with history, physical, neuro exam,assessment and plan as stated above.    Antonia Ahern, MD [facility] 
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
